# Patient Record
Sex: Female | Born: 1980
Health system: Southern US, Community
[De-identification: ages and names within clinical notes are randomized; demographics above are authoritative.]

## PROBLEM LIST (undated history)

## (undated) DIAGNOSIS — Z9289 Personal history of other medical treatment: Secondary | ICD-10-CM

## (undated) DIAGNOSIS — T7840XA Allergy, unspecified, initial encounter: Secondary | ICD-10-CM

## (undated) DIAGNOSIS — G43909 Migraine, unspecified, not intractable, without status migrainosus: Secondary | ICD-10-CM

## (undated) DIAGNOSIS — B019 Varicella without complication: Secondary | ICD-10-CM

## (undated) HISTORY — PX: WISDOM TOOTH EXTRACTION: SHX21

## (undated) HISTORY — DX: Personal history of other medical treatment: Z92.89

## (undated) HISTORY — DX: Allergy, unspecified, initial encounter: T78.40XA

## (undated) HISTORY — DX: Varicella without complication: B01.9

## (undated) HISTORY — PX: COLONOSCOPY: SHX174

## (undated) HISTORY — DX: Migraine, unspecified, not intractable, without status migrainosus: G43.909

---

## 2002-04-08 HISTORY — PX: LEFT OOPHORECTOMY: SHX1961

## 2013-08-18 ENCOUNTER — Ambulatory Visit: Payer: Self-pay | Admitting: Family Medicine

## 2015-11-23 DIAGNOSIS — R030 Elevated blood-pressure reading, without diagnosis of hypertension: Secondary | ICD-10-CM | POA: Insufficient documentation

## 2019-02-22 ENCOUNTER — Encounter: Payer: Self-pay | Admitting: Family Medicine

## 2019-02-22 ENCOUNTER — Ambulatory Visit (INDEPENDENT_AMBULATORY_CARE_PROVIDER_SITE_OTHER): Payer: No Typology Code available for payment source | Admitting: Family Medicine

## 2019-02-22 ENCOUNTER — Other Ambulatory Visit: Payer: Self-pay

## 2019-02-22 VITALS — BP 138/88 | HR 85 | Temp 98.1°F | Resp 16 | Ht 63.5 in | Wt 130.4 lb

## 2019-02-22 DIAGNOSIS — R030 Elevated blood-pressure reading, without diagnosis of hypertension: Secondary | ICD-10-CM

## 2019-02-22 DIAGNOSIS — Z13220 Encounter for screening for lipoid disorders: Secondary | ICD-10-CM

## 2019-02-22 DIAGNOSIS — Z0000 Encounter for general adult medical examination without abnormal findings: Secondary | ICD-10-CM | POA: Insufficient documentation

## 2019-02-22 DIAGNOSIS — D509 Iron deficiency anemia, unspecified: Secondary | ICD-10-CM | POA: Insufficient documentation

## 2019-02-22 DIAGNOSIS — Z131 Encounter for screening for diabetes mellitus: Secondary | ICD-10-CM | POA: Diagnosis not present

## 2019-02-22 DIAGNOSIS — Z7689 Persons encountering health services in other specified circumstances: Secondary | ICD-10-CM | POA: Insufficient documentation

## 2019-02-22 DIAGNOSIS — Z975 Presence of (intrauterine) contraceptive device: Secondary | ICD-10-CM | POA: Insufficient documentation

## 2019-02-22 LAB — CBC
HCT: 42.7 % (ref 36.0–46.0)
Hemoglobin: 14.4 g/dL (ref 12.0–15.0)
MCHC: 33.8 g/dL (ref 30.0–36.0)
MCV: 88.9 fl (ref 78.0–100.0)
Platelets: 236 10*3/uL (ref 150.0–400.0)
RBC: 4.81 Mil/uL (ref 3.87–5.11)
RDW: 13.8 % (ref 11.5–15.5)
WBC: 3.3 10*3/uL — ABNORMAL LOW (ref 4.0–10.5)

## 2019-02-22 LAB — COMPREHENSIVE METABOLIC PANEL
ALT: 12 U/L (ref 0–35)
AST: 15 U/L (ref 0–37)
Albumin: 4.5 g/dL (ref 3.5–5.2)
Alkaline Phosphatase: 38 U/L — ABNORMAL LOW (ref 39–117)
BUN: 9 mg/dL (ref 6–23)
CO2: 25 mEq/L (ref 19–32)
Calcium: 9.2 mg/dL (ref 8.4–10.5)
Chloride: 105 mEq/L (ref 96–112)
Creatinine, Ser: 0.78 mg/dL (ref 0.40–1.20)
GFR: 81.9 mL/min (ref >60.00)
Glucose, Bld: 91 mg/dL (ref 70–99)
Potassium: 4.2 mEq/L (ref 3.5–5.1)
Sodium: 138 mEq/L (ref 135–145)
Total Bilirubin: 0.7 mg/dL (ref 0.2–1.2)
Total Protein: 6.9 g/dL (ref 6.0–8.3)

## 2019-02-22 LAB — HEMOGLOBIN A1C: Hgb A1c MFr Bld: 5.3 % (ref 4.6–6.5)

## 2019-02-22 LAB — TSH: TSH: 2.12 u[IU]/mL (ref 0.35–4.50)

## 2019-02-22 LAB — LIPID PANEL
Cholesterol: 219 mg/dL — ABNORMAL HIGH (ref 0–200)
HDL: 83.8 mg/dL (ref >39.00)
LDL Cholesterol: 125 mg/dL — ABNORMAL HIGH (ref 0–99)
NonHDL: 134.89
Total CHOL/HDL Ratio: 3
Triglycerides: 51 mg/dL (ref 0.0–149.0)
VLDL: 10.2 mg/dL (ref 0.0–40.0)

## 2019-02-22 NOTE — Patient Instructions (Addendum)
  Health Maintenance, Female Adopting a healthy lifestyle and getting preventive care are important in promoting health and wellness. Ask your health care provider about:  The right schedule for you to have regular tests and exams.  Things you can do on your own to prevent diseases and keep yourself healthy. What should I know about diet, weight, and exercise? Eat a healthy diet   Eat a diet that includes plenty of vegetables, fruits, low-fat dairy products, and lean protein.  Do not eat a lot of foods that are high in solid fats, added sugars, or sodium. Maintain a healthy weight Body mass index (BMI) is used to identify weight problems. It estimates body fat based on height and weight. Your health care provider can help determine your BMI and help you achieve or maintain a healthy weight. Get regular exercise Get regular exercise. This is one of the most important things you can do for your health. Most adults should:  Exercise for at least 150 minutes each week. The exercise should increase your heart rate and make you sweat (moderate-intensity exercise).  Do strengthening exercises at least twice a week. This is in addition to the moderate-intensity exercise.  Spend less time sitting. Even light physical activity can be beneficial. Watch cholesterol and blood lipids Have your blood tested for lipids and cholesterol at 39 years of age, then have this test every 5 years. Have your cholesterol levels checked more often if:  Your lipid or cholesterol levels are high.  You are older than 40 years of age.  You are at high risk for heart disease. What should I know about cancer screening? Depending on your health history and family history, you may need to have cancer screening at various ages. This may include screening for:  Breast cancer.  Cervical cancer.  Colorectal cancer.  Skin cancer.  Lung cancer. What should I know about heart disease, diabetes, and high blood  pressure? Blood pressure and heart disease  High blood pressure causes heart disease and increases the risk of stroke. This is more likely to develop in people who have high blood pressure readings, are of African descent, or are overweight.  Have your blood pressure checked: ? Every 3-5 years if you are 18-39 years of age. ? Every year if you are 40 years old or older. Diabetes Have regular diabetes screenings. This checks your fasting blood sugar level. Have the screening done:  Once every three years after age 40 if you are at a normal weight and have a low risk for diabetes.  More often and at a younger age if you are overweight or have a high risk for diabetes. What should I know about preventing infection? Hepatitis B If you have a higher risk for hepatitis B, you should be screened for this virus. Talk with your health care provider to find out if you are at risk for hepatitis B infection. Hepatitis C Testing is recommended for:  Everyone born from 1945 through 1965.  Anyone with known risk factors for hepatitis C. Sexually transmitted infections (STIs)  Get screened for STIs, including gonorrhea and chlamydia, if: ? You are sexually active and are younger than 39 years of age. ? You are older than 39 years of age and your health care provider tells you that you are at risk for this type of infection. ? Your sexual activity has changed since you were last screened, and you are at increased risk for chlamydia or gonorrhea. Ask your health care   you are at risk.  Ask your health care provider about whether you are at high risk for HIV. Your health care provider may recommend a prescription medicine to help prevent HIV infection. If you choose to take medicine to prevent HIV, you should first get tested for HIV. You should then be tested every 3 months for as long as you are taking the medicine. Pregnancy  If you are about to stop having your period (premenopausal) and  you may become pregnant, seek counseling before you get pregnant.  Take 400 to 800 micrograms (mcg) of folic acid every day if you become pregnant.  Ask for birth control (contraception) if you want to prevent pregnancy. Osteoporosis and menopause Osteoporosis is a disease in which the bones lose minerals and strength with aging. This can result in bone fractures. If you are 65 years old or older, or if you are at risk for osteoporosis and fractures, ask your health care provider if you should:  Be screened for bone loss.  Take a calcium or vitamin D supplement to lower your risk of fractures.  Be given hormone replacement therapy (HRT) to treat symptoms of menopause. Follow these instructions at home: Lifestyle  Do not use any products that contain nicotine or tobacco, such as cigarettes, e-cigarettes, and chewing tobacco. If you need help quitting, ask your health care provider.  Do not use street drugs.  Do not share needles.  Ask your health care provider for help if you need support or information about quitting drugs. Alcohol use  Do not drink alcohol if: ? Your health care provider tells you not to drink. ? You are pregnant, may be pregnant, or are planning to become pregnant.  If you drink alcohol: ? Limit how much you use to 0-1 drink a day. ? Limit intake if you are breastfeeding.  Be aware of how much alcohol is in your drink. In the [place] one drink equals one 12 oz bottle of beer (355 mL), one 5 oz glass of wine (148 mL), or one 1 oz glass of hard liquor (44 mL). General instructions  Schedule regular health, dental, and eye exams.  Stay current with your vaccines.  Tell your health care provider if: ? You often feel depressed. ? You have ever been abused or do not feel safe at home. Summary  Adopting a healthy lifestyle and getting preventive care are important in promoting health and wellness.  Follow your health care provider's instructions about healthy  diet, exercising, and getting tested or screened for diseases.  Follow your health care provider's instructions on monitoring your cholesterol and blood pressure. This information is not intended to replace advice given to you by your health care provider. Make sure you discuss any questions you have with your health care provider. Document Released: 10/08/2010 Document Revised: 03/18/2018 Document Reviewed: 03/18/2018 Elsevier Patient Education  2020 Else[employer]  Please help us help you:  We are honored you have chosen [facility] for your Primary Care home. Below you will find basic instructions that you may need to access in the future. Please help us help you by reading the instructions, which cover many of the frequent questions we experience.   Prescription refills and request:  -In order to allow more efficient response time, please call your pharmacy for all refills. They will forward the request electronically to us. This allows for the quickest possible response. Request left on a nurse line can take longer to refill, since these are checked   as time allows between office patients and other phone calls.  - refill request can take up to 3-5 working days to complete.  - If request is sent electronically and request is appropiate, it is usually completed in 1-2 business days.  - all patients will need to be seen routinely for all chronic medical conditions requiring prescription medications (see follow-up below). If you are overdue for follow up on your condition, you will be asked to make an appointment and we will call in enough medication to cover you until your appointment (up to 30 days).  - all controlled substances will require a face to face visit to request/refill.  - if you desire your prescriptions to go through a new pharmacy, and have an active script at original pharmacy, you will need to call your pharmacy and have scripts transferred to new pharmacy. This is completed  between the pharmacy locations and not by your provider.    Results: If any images or labs were ordered, it can take up to 1 week to get results depending on the test ordered and the lab/facility running and resulting the test. - Normal or stable results, which do not need further discussion, may be released to your mychart immediately with attached note to you. A call may not be generated for normal results. Please make certain to sign up for mychart. If you have questions on how to activate your mychart you can call the front office.  - If your results need further discussion, our office will attempt to contact you via phone, and if unable to reach you after 2 attempts, we will release your abnormal result to your mychart with instructions.  - All results will be automatically released in mychart after 1 week.  - Your provider will provide you with explanation and instruction on all relevant material in your results. Please keep in mind, results and labs may appear confusing or abnormal to the untrained eye, but it does not mean they are actually abnormal for you personally. If you have any questions about your results that are not covered, or you desire more detailed explanation than what was provided, you should make an appointment with your provider to do so.   Our office handles many outgoing and incoming calls daily. If we have not contacted you within 1 week about your results, please check your mychart to see if there is a message first and if not, then contact our office.  In helping with this matter, you help decrease call volume, and therefore allow us to be able to respond to patients needs more efficiently.   Acute office visits (sick visit):  An acute visit is intended for a new problem and are scheduled in shorter time slots to allow schedule openings for patients with new problems. This is the appropriate visit to discuss a new problem. Problems will not be addressed by phone call or  Echart message. Appointment is needed if requesting treatment. In order to provide you with excellent quality medical care with proper time for you to explain your problem, have an exam and receive treatment with instructions, these appointments should be limited to one new problem per visit. If you experience a new problem, in which you desire to be addressed, please make an acute office visit, we save openings on the schedule to accommodate you. Please do not save your new problem for any other type of visit, let us take care of it properly and quickly for you.   Follow   up visits:  Depending on your condition(s) your provider will need to see you routinely in order to provide you with quality care and prescribe medication(s). Most chronic conditions (Example: hypertension, Diabetes, depression/anxiety... etc), require visits a couple times a year. Your provider will instruct you on proper follow up for your personal medical conditions and history. Please make certain to make follow up appointments for your condition as instructed. Failing to do so could result in lapse in your medication treatment/refills. If you request a refill, and are overdue to be seen on a condition, we will always provide you with a 30 day script (once) to allow you time to schedule.    Medicare wellness (well visit): - we have a wonderful Nurse (Kim), that will meet with you and provide you will yearly medicare wellness visits. These visits should occur yearly (can not be scheduled less than 1 calendar year apart) and cover preventive health, immunizations, advance directives and screenings you are entitled to yearly through your medicare benefits. Do not miss out on your entitled benefits, this is when medicare will pay for these benefits to be ordered for you.  These are strongly encouraged by your provider and is the appropriate type of visit to make certain you are up to date with all preventive health benefits. If you have not  had your medicare wellness exam in the last 12 months, please make certain to schedule one by calling the office and schedule your medicare wellness with Kim as soon as possible.   Yearly physical (well visit):  - Adults are recommended to be seen yearly for physicals. Check with your insurance and date of your last physical, most insurances require one calendar year between physicals. Physicals include all preventive health topics, screenings, medical exam and labs that are appropriate for gender/age and history. You may have fasting labs needed at this visit. This is a well visit (not a sick visit), new problems should not be covered during this visit (see acute visit).  - Pediatric patients are seen more frequently when they are younger. Your provider will advise you on well child visit timing that is appropriate for your their age. - This is not a medicare wellness visit. Medicare wellness exams do not have an exam portion to the visit. Some medicare companies allow for a physical, some do not allow a yearly physical. If your medicare allows a yearly physical you can schedule the medicare wellness with our nurse Kim and have your physical with your provider after, on the same day. Please check with insurance for your full benefits.   Late Policy/No Shows:  - all new patients should arrive 15-30 minutes earlier than appointment to allow us time  to  obtain all personal demographics,  insurance information and for you to complete office paperwork. - All established patients should arrive 10-15 minutes earlier than appointment time to update all information and be checked in .  - In our best efforts to run on time, if you are late for your appointment you will be asked to either reschedule or if able, we will work you back into the schedule. There will be a wait time to work you back in the schedule,  depending on availability.  - If you are unable to make it to your appointment as scheduled, please call  24 hours ahead of time to allow us to fill the time slot with someone else who needs to be seen. If you do not cancel your appointment ahead of   time, you may be charged a no show fee.  

## 2019-02-22 NOTE — Progress Notes (Signed)
Patient ID: Dawn Warren, female  DOB: 1980-07-15, 38 y.o.   MRN: [id number] Patient Care Team    Relationship Specialty Notifications Start End  Ma Hillock, DO PCP - General Family Medicine  02/22/19   Ob/Gyn, [facility]    02/22/19    Comment: Dr Nori Riis     Chief Complaint  Patient presents with  . [place] requested. Dr Nori Riis pap smear scheduled for 02/24/2019.     Subjective:  Dawn Warren is a 38 y.o.  female present for new patient establishment/acute problem. All past medical history, surgical history, allergies, family history, immunizations, medications and social history were updated in the electronic medical record today. All recent labs, ED visits and hospitalizations within the last year were reviewed.  Health maintenance:  Colonoscopy:no fhx. Routine screen at 49 Mammogram/PAP: Has an appt with Dr. Nori Riis next week.Mirena  IUD in place (2017) Immunizations: tdap UTD 2017, influenza UTD 01/2019 Infectious disease screening: HIV completed w/ pregnancy.    Depression screen PHQ 2/9 02/22/2019  Decreased Interest 0  Down, Depressed, Hopeless 0  PHQ - 2 Score 0   No flowsheet data found.      No flowsheet data found. Immunization History  Administered Date(s) Administered  . Influenza, Quadrivalent, Recombinant, Inj, Pf 11/23/2015  . PPD Test 11/20/2016, 12/01/2017    No exam data present  Past Medical History:  Diagnosis Date  . Allergy   . Chicken pox   . History of blood transfusion    unknown cause of anemia.   . Migraines    No Known Allergies Past Surgical History:  Procedure Laterality Date  . CESAREAN SECTION     2000, 2003, 2008, 2010  . COLONOSCOPY     2004, 2006  . LEFT OOPHORECTOMY  2004  . WISDOM TOOTH EXTRACTION     Family History  Problem Relation Age of Onset  . Rheum arthritis Mother   . Premature birth Son    Social History   Social History Narrative   Marital  status/children/pets: Married, 4 children.    Education/employment: Master in Nursing. Works at [employer].    Safety:      -smoke alarm in the home:Yes     - wears seatbelt: Yes     - Feels safe in their relationships: Yes    Allergies as of 02/22/2019   No Known Allergies     Medication List       Accurate as of February 22, 2019 12:26 PM. If you have any questions, ask your nurse or doctor.        acetaminophen 325 MG tablet Commonly known as: TYLENOL Take 650 mg by mouth every 6 (six) hours as needed.   levonorgestrel 20 MCG/24HR IUD Commonly known as: MIRENA 1 each by Intrauterine route once.      All past medical history, surgical history, allergies, family history, immunizations andmedications were updated in the EMR today and reviewed under the history and medication portions of their EMR.    No results found for this or any previous visit (from the past 2160 hour(s)).  Patient was never admitted.   ROS: 14 pt review of systems performed and negative (unless mentioned in an HPI)  Objective: BP 138/88 (BP Location: Left Arm, Patient Position: Sitting, Cuff Size: Normal)   Pulse 85   Temp 98.1 F (36.7 C) (Temporal)   Resp 16   Ht 5' 3.5" (1.613 m)  Wt 130 lb 6 oz (59.1 kg)   SpO2 98%   BMI 22.73 kg/m  Gen: Afebrile. No acute distress. Nontoxic in appearance, well-developed, well-nourished,  Very pleasant AAF. HENT: AT. [place]. Bilateral TM visualized and normal in appearance, normal external auditory canal. MMM, no oral lesions, adequate dentition. Bilateral nares within normal limits. Throat without erythema, ulcerations or exudates. no Cough on exam, no hoarseness on exam. Eyes:Pupils Equal Round Reactive to light, Extraocular movements intact,  Conjunctiva without redness, discharge or icterus. Neck/lymp/endocrine: Supple,no lymphadenopathy, no thyromegaly CV: RRR no murmur, no edema, +2/4 P posterior tibialis pulses.  Chest: CTAB, no wheeze, rhonchi or  crackles. normal Respiratory effort. good Air movement. Abd: Soft. flat. NTND. BS present. no Masses palpated. No hepatosplenomegaly. No rebound tenderness or guarding. Skin: no rashes, purpura or petechiae. Warm and well-perfused. Skin intact. Neuro/Msk:  Normal gait. PERLA. EOMi. Alert. Oriented x3.  Cranial nerves II through XII intact. Muscle strength 5/5 upper/lower extremity. DTRs equal bilaterally. Psych: Normal affect, dress and demeanor. Normal speech. Normal thought content and judgment.   Assessment/plan: Rajanee Schuelke is a 38 y.o. female present for est care Iron deficiency anemia, unspecified iron deficiency anemia type - pt reports h/o bld transfusion 2012 for anemia- unknown cause. 2017 IDA that responded to iron supplement.  - unknown testing surrounding anemia.  - CBC Lipid screening - Lipid panel Diabetes mellitus screening - Hemoglobin A1c Elevated BP without diagnosis of hypertension - borderline today and h/o elevation in the past. Pt reports prior it had been related to stress and she again is under some stress with interviews, school and her children's home school.  - Comp Met (CMET) - TSH Encounter for preventive health examination Patient was encouraged to exercise greater than 150 minutes a week. Patient was encouraged to choose a diet filled with fresh fruits and vegetables, and lean meats. AVS provided to patient today for education/recommendation on gender specific health and safety maintenance. Colonoscopy:no fhx. Routine screen at 49 Mammogram/PAP: Dawn Warren appt with Dr. Nori Riis next week.  Immunizations: tdap UTD 2017, influenza UTD 01/2019 Infectious disease screening: HIV completed w/ pregnancy.    Return in about 1 year (around 02/22/2020), or if symptoms worsen or fail to improve, for CPE (30 min).  Greater than 30 minutes spent with patient, >50% of time spent face to face   Note is dictated utilizing voice recognition software. Although note has  been proof read prior to signing, occasional typographical errors still can be missed. If any questions arise, please do not hesitate to call for verification.  Electronically signed by: Howard Pouch, DO [place]

## 2019-02-23 ENCOUNTER — Telehealth: Payer: Self-pay | Admitting: Family Medicine

## 2019-02-23 NOTE — Telephone Encounter (Signed)
Pt was called and given lab results, she verbalized understanding.  

## 2019-02-23 NOTE — Telephone Encounter (Signed)
Please inform patient the following information: Her liver/kidney/thyroid are functioning normal.  Her diabetes screening/A1c is in normal range at 5.3. Her cholesterol looks great with a total cholesterol of 219, good cholesterol/HDL of 83, LDL/bad cholesterol 125 and triglycerides 51.  Her blood counts were normal, with the exception of mildly lower than normal level of WBC at 3.3.  I was able to review 1 additional lab collection in the past through the care everywhere tab in the EMR,  and this was pretty consistent what the level at that time of 3.7.  This is likely her normal, but we will continue to monitor with her yearly physicals.

## 2019-03-02 ENCOUNTER — Other Ambulatory Visit: Payer: Self-pay | Admitting: Obstetrics & Gynecology

## 2019-03-02 DIAGNOSIS — R928 Other abnormal and inconclusive findings on diagnostic imaging of breast: Secondary | ICD-10-CM

## 2019-03-09 ENCOUNTER — Other Ambulatory Visit: Payer: Self-pay

## 2019-03-09 ENCOUNTER — Telehealth: Payer: Self-pay | Admitting: Family Medicine

## 2019-03-09 ENCOUNTER — Ambulatory Visit
Admission: RE | Admit: 2019-03-09 | Discharge: 2019-03-09 | Disposition: A | Discharge status: Home / Self Care | Payer: No Typology Code available for payment source | Source: Ambulatory Visit | Attending: Obstetrics & Gynecology | Admitting: Obstetrics & Gynecology

## 2019-03-09 DIAGNOSIS — R928 Other abnormal and inconclusive findings on diagnostic imaging of breast: Secondary | ICD-10-CM

## 2019-03-09 NOTE — Telephone Encounter (Signed)
Pt was called and given labs results, pt verbalizes understanding.   Pt was sent Code for My chart. She would like results sent to her once she has gotten on my chart. Pt was asked to send message when she does get it and I can send her Dr Dierdre Highman recommendations, she verbalized understanding. Pt did not want to schedule F/U appt at this time

## 2019-03-09 NOTE — Telephone Encounter (Signed)
Patient had mammogram that showed calcium deposits in her breast. The technician advised the patient it was a little unusual for her to have them since she is not a diabetic and to contact her primary care provider. Patient would like to know if she should have any other imaging done.

## 2019-03-09 NOTE — Telephone Encounter (Signed)
Patient's mammogram did not show evidence of any malignancy or cancers.  There were benign left breast arterial calcifications.  Calcification in any arteries is a sign of arteriolosclerosis.  If calcifications are appreciated in the breast arteries, they are likely also in other arteries of her body.  This is typically caused by diabetes, hypercholesterol or hypertension.   - She has been seen once, she established in November with a CPE.  During her CPE labs>>> she does not have diabetes.   Her cholesterol is well controlled.  She also did not report any family history of cardiac disease.  - She did have borderline elevated blood pressures.  Would advise her to follow-up in 3 months for recheck with provider and if blood pressures are still borderline or elevated, would encourage to start medication at that time.  In the meantime maintaining routine exercise and a heart healthy diet, such as a Mediterranean diet would be helpful.

## 2019-03-09 NOTE — Telephone Encounter (Signed)
Pt was called told that we do not have a report that has been read by radiologist. She just had mammogram done 1 hour ago. Pt was assured she would be called as soon as these were read and sent to Dr Raoul Pitch

## 2019-03-15 ENCOUNTER — Other Ambulatory Visit: Payer: Self-pay

## 2019-03-15 ENCOUNTER — Ambulatory Visit (INDEPENDENT_AMBULATORY_CARE_PROVIDER_SITE_OTHER): Payer: No Typology Code available for payment source | Admitting: Family Medicine

## 2019-03-15 ENCOUNTER — Encounter: Payer: Self-pay | Admitting: Family Medicine

## 2019-03-15 VITALS — BP 122/84 | HR 103 | Temp 97.6°F | Resp 16 | Ht 64.0 in | Wt 130.2 lb

## 2019-03-15 DIAGNOSIS — R0789 Other chest pain: Secondary | ICD-10-CM | POA: Diagnosis not present

## 2019-03-15 NOTE — Progress Notes (Signed)
This visit occurred during the SARS-CoV-2 public health emergency.  Safety protocols were in place, including screening questions prior to the visit, additional usage of staff PPE, and extensive cleaning of exam room while observing appropriate contact time as indicated for disinfecting solutions.    Dawn Warren , 1980-05-06, 38 y.o., female MRN: [id number] Patient Care Team    Relationship Specialty Notifications Start End  Ma Hillock, DO PCP - General Family Medicine  02/22/19   Ob/Gyn, [facility]    02/22/19    Comment: Dr Nori Riis     Chief Complaint  Patient presents with  . Hypertension    Pt has been concerned over BP readings at work and is having off and on chest discomfort, this last will only last a minute or two and goes away. Denies SOB, lightheadness, arm/jaw pain.   . Discuss mammogram results     Subjective: Pt presents for an OV with concerns over elevated blood pressure readings.  She was seen here approximately 3 weeks ago with borderline blood pressures at that time.  She reports she has been checking her blood pressures since that time and some have been elevated, but they seem to have leveled out.  She endorses some chest discomfort caused her to have some extra concern since she had borderline blood pressures.  She states the chest discomfort started shortly after her last visit 3 weeks ago.  The pain can be rather random in nature and quick and fleeting on discomfort.  It is located in her central chest and does not radiate.  It is not worse with activity.  She has not tried anything for it.  She states sometimes her heart rate will get increased but that only occurs when she is becoming anxious.  She does work at the hospital and moves patients frequently.  She endorses her chest is uncomfortable if she pushes over the area of her concern.  She denies any dizziness, shortness of breath or lower extremity edema  Depression screen PHQ 2/9 02/22/2019  Decreased  Interest 0  Down, Depressed, Hopeless 0  PHQ - 2 Score 0    No Known Allergies Social History   Social History Narrative   Marital status/children/pets: Married, 4 children.    Education/employment: Master in Nursing. Works at [employer].    Safety:      -smoke alarm in the home:Yes     - wears seatbelt: Yes     - Feels safe in their relationships: Yes   Past Medical History:  Diagnosis Date  . Allergy   . Chicken pox   . History of blood transfusion    unknown cause of anemia.   Marland Kitchen Migraines    Past Surgical History:  Procedure Laterality Date  . CESAREAN SECTION     2000, 2003, 2008, 2010  . COLONOSCOPY     2004, 2006  . LEFT OOPHORECTOMY  2004  . WISDOM TOOTH EXTRACTION     Family History  Problem Relation Age of Onset  . Rheum arthritis Mother   . Premature birth Son    Allergies as of 03/15/2019   No Known Allergies     Medication List       Accurate as of March 15, 2019  2:45 PM. If you have any questions, ask your nurse or doctor.        STOP taking these medications   acetaminophen 325 MG tablet Commonly known as: TYLENOL Stopped by: Howard Pouch, DO  TAKE these medications   co-enzyme Q-10 30 MG capsule Take 30 mg by mouth daily.   levonorgestrel 20 MCG/24HR IUD Commonly known as: MIRENA 1 each by Intrauterine route once.   Omega 3 1000 MG Caps Take 1 capsule by mouth daily.   Red Yeast Rice 600 MG Caps Take 1 capsule by mouth daily.       All past medical history, surgical history, allergies, family history, immunizations andmedications were updated in the EMR today and reviewed under the history and medication portions of their EMR.     ROS: Negative, with the exception of above mentioned in HPI   Objective:  BP 122/84 (BP Location: Left Arm, Patient Position: Sitting, Cuff Size: Normal)   Pulse (!) 103   Temp 97.6 F (36.4 C) (Temporal)   Resp 16   Ht 5\' 4"  (1.626 m)   Wt 130 lb 4 oz (59.1 kg)   LMP 03/01/2019  (Exact Date)   SpO2 99%   BMI 22.36 kg/m  Body mass index is 22.36 kg/m. Gen: Afebrile. No acute distress. Nontoxic in appearance, well developed, well nourished.  Very pleasant thin African-American female. HENT: AT. [place]. Eyes:Pupils Equal Round Reactive to light, Extraocular movements intact,  Conjunctiva without redness, discharge or icterus. CV: RRR (not tachycardic during exam) no murmur, no edema Chest: CTAB, no wheeze or crackles. Good air movement, normal resp effort.  TTP midline chest. Neuro:  Normal gait. PERLA. EOMi. Alert. Oriented x3  Psych: Normal affect, dress and demeanor. Normal speech. Normal thought content and judgment.  No exam data present No results found. No results found for this or any previous visit (from the past 24 hour(s)).  Assessment/Plan: Dawn Warren is a 38 y.o. female present for OV for  Costochondral chest pain His blood pressure is normal here today.  Her symptoms are more consistent with costochondritis.  She is tender to palpation over the area of her concern on exam today. Reviewed mammogram results with vessel calcifications.  This was explained to her in more detail today.  She reports understanding.  Her cholesterol panel has been good.  She is a non-smoker.  Her blood pressure is controlled.  And she is not a diabetic. AVS on costochondritis was supplied to her today.  Discussed NSAID therapy if symptoms become worse.   Reviewed expectations re: course of current medical issues.  Discussed self-management of symptoms.  Outlined signs and symptoms indicating need for more acute intervention.  Patient verbalized understanding and all questions were answered.  Patient received an After-Visit Summary.    No orders of the defined types were placed in this encounter.  > 15 minutes spent with patient, > 50% of that time face to face     Note is dictated utilizing voice recognition software. Although note has been proof read prior to  signing, occasional typographical errors still can be missed. If any questions arise, please do not hesitate to call for verification.   electronically signed by:  24, DO  [facility] [place]

## 2019-03-15 NOTE — Patient Instructions (Signed)
Mediterranean Diet A Mediterranean diet refers to food and lifestyle choices that are based on the traditions of countries located on the [place]. This way of eating has been shown to help prevent certain conditions and improve outcomes for people who have chronic diseases, like kidney disease and heart disease. What are tips for following this plan? Lifestyle  Cook and eat meals together with your family, when possible.  Drink enough fluid to keep your urine clear or pale yellow.  Be physically active every day. This includes: ? Aerobic exercise like running or swimming. ? Leisure activities like gardening, walking, or housework.  Get 7-8 hours of sleep each night.  If recommended by your health care provider, drink red wine in moderation. This means 1 glass a day for nonpregnant women and 2 glasses a day for men. A glass of wine equals 5 oz (150 mL). Reading food labels   Check the serving size of packaged foods. For foods such as rice and pasta, the serving size refers to the amount of cooked product, not dry.  Check the total fat in packaged foods. Avoid foods that have saturated fat or trans fats.  Check the ingredients list for added sugars, such as corn syrup. Shopping  At the grocery store, buy most of your food from the areas near the walls of the store. This includes: ? Fresh fruits and vegetables (produce). ? Grains, beans, nuts, and seeds. Some of these may be available in unpackaged forms or large amounts (in bulk). ? Fresh seafood. ? Poultry and eggs. ? Low-fat dairy products.  Buy whole ingredients instead of prepackaged foods.  Buy fresh fruits and vegetables in-season from local farmers markets.  Buy frozen fruits and vegetables in resealable bags.  If you do not have access to quality fresh seafood, buy precooked frozen shrimp or canned fish, such as tuna, salmon, or sardines.  Buy small amounts of raw or cooked vegetables, salads, or olives  from the deli or salad bar at your store.  Stock your pantry so you always have certain foods on hand, such as olive oil, canned tuna, canned tomatoes, rice, pasta, and beans. Cooking  Cook foods with extra-virgin olive oil instead of using butter or other vegetable oils.  Have meat as a side dish, and have vegetables or grains as your main dish. This means having meat in small portions or adding small amounts of meat to foods like pasta or stew.  Use beans or vegetables instead of meat in common dishes like chili or lasagna.  Experiment with different cooking methods. Try roasting or broiling vegetables instead of steaming or sauteing them.  Add frozen vegetables to soups, stews, pasta, or rice.  Add nuts or seeds for added healthy fat at each meal. You can add these to yogurt, salads, or vegetable dishes.  Marinate fish or vegetables using olive oil, lemon juice, garlic, and fresh herbs. Meal planning   Plan to eat 1 vegetarian meal one day each week. Try to work up to 2 vegetarian meals, if possible.  Eat seafood 2 or more times a week.  Have healthy snacks readily available, such as: ? Vegetable sticks with hummus. ? [place] yogurt. ? Fruit and nut trail mix.  Eat balanced meals throughout the week. This includes: ? Fruit: 2-3 servings a day ? Vegetables: 4-5 servings a day ? Low-fat dairy: 2 servings a day ? Fish, poultry, or lean meat: 1 serving a day ? Beans and legumes: 2 or more servings  a week ? Nuts and seeds: 1-2 servings a day ? Whole grains: 6-8 servings a day ? Extra-virgin olive oil: 3-4 servings a day  Limit red meat and sweets to only a few servings a month What are my food choices?  Mediterranean diet ? Recommended  Grains: Whole-grain pasta. Brown rice. Bulgar wheat. Polenta. Couscous. Whole-wheat bread. [place].  Vegetables: Artichokes. Beets. Broccoli. Cabbage. Carrots. Eggplant. Green beans. Chard. Kale. Spinach. Onions. Leeks. Peas.  Squash. Tomatoes. Peppers. Radishes.  Fruits: Apples. Apricots. Avocado. Berries. Bananas. Cherries. Dates. Figs. Grapes. Lemons. Melon. Oranges. Peaches. Plums. Pomegranate.  Meats and other protein foods: Beans. Almonds. Sunflower seeds. Pine nuts. Peanuts. [place]. Salmon. Scallops. Shrimp. [place]. Tilapia. Clams. Oysters. Eggs.  Dairy: Low-fat milk. Cheese. Greek yogurt.  Beverages: Water. Red wine. Herbal tea.  Fats and oils: Extra virgin olive oil. Avocado oil. Grape seed oil.  Sweets and desserts: [place] yogurt with honey. Baked apples. Poached pears. Trail mix.  Seasoning and other foods: Basil. Cilantro. Coriander. Cumin. Mint. Parsley. Sage. Rosemary. Tarragon. Garlic. Oregano. Thyme. Pepper. Balsalmic vinegar. Tahini. Hummus. Tomato sauce. Olives. Mushrooms. ? Limit these  Grains: Prepackaged pasta or rice dishes. Prepackaged cereal with added sugar.  Vegetables: Deep fried potatoes (french fries).  Fruits: Fruit canned in syrup.  Meats and other protein foods: Beef. Pork. Lamb. Poultry with skin. Hot dogs. Berniece Salines.  Dairy: Ice cream. Sour cream. Whole milk.  Beverages: Juice. Sugar-sweetened soft drinks. Beer. Liquor and spirits.  Fats and oils: Butter. Canola oil. Vegetable oil. Beef fat (tallow). Lard.  Sweets and desserts: Cookies. Cakes. Pies. Candy.  Seasoning and other foods: Mayonnaise. Premade sauces and marinades. The items listed may not be a complete list. Talk with your dietitian about what dietary choices are right for you. Summary  The Mediterranean diet includes both food and lifestyle choices.  Eat a variety of fresh fruits and vegetables, beans, nuts, seeds, and whole grains.  Limit the amount of red meat and sweets that you eat.  Talk with your health care provider about whether it is safe for you to drink red wine in moderation. This means 1 glass a day for nonpregnant women and 2 glasses a day for men. A glass of wine equals 5 oz (150 mL). This  information is not intended to replace advice given to you by your health care provider. Make sure you discuss any questions you have with your health care provider. Document Released: 11/16/2015 Document Revised: 11/23/2015 Document Reviewed: 11/16/2015 Elsevier Patient Education  [place].   Costochondritis Costochondritis is swelling and irritation (inflammation) of the tissue (cartilage) that connects your ribs to your breastbone (sternum). This causes pain in the front of your chest. Usually, the pain:  Starts gradually.  Is in more than one rib. This condition usually goes away on its own over time. Follow these instructions at home:  Do not do anything that makes your pain worse.  If directed, put ice on the painful area: ? Put ice in a plastic bag. ? Place a towel between your skin and the bag. ? Leave the ice on for 20 minutes, 2-3 times a day.  If directed, put heat on the affected area as often as told by your doctor. Use the heat source that your doctor tells you to use, such as a moist heat pack or a heating pad. ? Place a towel between your skin and the heat source. ? Leave the heat on for 20-30 minutes. ? Take off the heat if your  skin turns bright red. This is very important if you cannot feel pain, heat, or cold. You may have a greater risk of getting burned.  Take over-the-counter and prescription medicines only as told by your doctor.  Return to your normal activities as told by your doctor. Ask your doctor what activities are safe for you.  Keep all follow-up visits as told by your doctor. This is important. Contact a doctor if:  You have chills or a fever.  Your pain does not go away or it gets worse.  You have a cough that does not go away. Get help right away if:  You are short of breath. This information is not intended to replace advice given to you by your health care provider. Make sure you discuss any questions you have with your health  care provider. Document Released: 09/11/2007 Document Revised: 04/09/2017 Document Reviewed: 07/19/2015 Elsevier Patient Education  2020 [employer].

## 2019-11-02 ENCOUNTER — Telehealth: Payer: Self-pay

## 2019-11-02 NOTE — Telephone Encounter (Signed)
Pt was called and scheduled for a nurse visit to do vision and hearing.

## 2019-11-02 NOTE — Telephone Encounter (Signed)
Patient called about filling out physical form. She is enrolling in [facility] to their Nurse Practitioner program and needs this completed and signed. She does not have insurance at this time. She also wants to know the cost of the visit.  I told her I could not give give a qoute but only give her a ball park price,  Please review form patient sent me.   She stated Dr. Claiborne Billings did a physical on her when she was here in November 2020.  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  Please call patient at [phone].  I have emailed forms to Lurena Joiner  Thank you!

## 2019-11-02 NOTE — Telephone Encounter (Signed)
Patient is sending updated immunization records to [facility].  Patient would like to schedule the hearing & vision portion of the paperwork to be completed. How should she be scheduled?

## 2019-11-02 NOTE — Telephone Encounter (Signed)
I printed the paperwork needing to be filled out. We do not have any immunization records for her. She said her prior PCP was [facility] but the only thing we received from them is the two TB tests she received in 2018-2019. We are unable to fill any of that out unless we get the immunizations records. We also did not do  A hearing or vision test and they are asking for TB test or the Lab drawn for TB.   Dawn Warren she will have to provide [place] or we would have to get immunization records. I tried looking her up in the [facility] database and she is not in there.

## 2019-11-02 NOTE — Telephone Encounter (Signed)
If she is just needing the hearing, vision and PPD test-then she can be scheduled as a nurse visit to have those completed.  She is needing the blood test, she will need a provider appointment so that I can document reasoning for lab draw.

## 2019-11-02 NOTE — Telephone Encounter (Signed)
Placed paperwork on Dr Blenda Bridegroom desk to review and give guidance. Pt will need vision, hearing, and TB test.

## 2019-11-04 ENCOUNTER — Ambulatory Visit (INDEPENDENT_AMBULATORY_CARE_PROVIDER_SITE_OTHER): Payer: Self-pay

## 2019-11-04 ENCOUNTER — Other Ambulatory Visit: Payer: Self-pay

## 2019-11-04 DIAGNOSIS — Z0000 Encounter for general adult medical examination without abnormal findings: Secondary | ICD-10-CM

## 2019-11-04 DIAGNOSIS — Z0110 Encounter for examination of ears and hearing without abnormal findings: Secondary | ICD-10-CM

## 2019-11-04 DIAGNOSIS — Z0100 Encounter for examination of eyes and vision without abnormal findings: Secondary | ICD-10-CM

## 2019-11-04 NOTE — Progress Notes (Signed)
Pt came in for hearing/vision screening. Readings were documented in chart.

## 2019-11-04 NOTE — Telephone Encounter (Signed)
Vision and hearing test completed. Placed on Dr Blenda Bridegroom desk to review.

## 2019-11-05 NOTE — Telephone Encounter (Signed)
Pt was called and told she could come and pick up completed paperwork. Copy made and sent to scan.

## 2019-11-05 NOTE — Telephone Encounter (Signed)
Forms completed and returned to RB work station

## 2021-01-12 ENCOUNTER — Other Ambulatory Visit (HOSPITAL_BASED_OUTPATIENT_CLINIC_OR_DEPARTMENT_OTHER): Payer: Self-pay

## 2021-01-12 MED ORDER — FLUARIX QUADRIVALENT 0.5 ML IM SUSY
PREFILLED_SYRINGE | INTRAMUSCULAR | 0 refills | Status: AC
  Filled 2021-01-12: qty 0.5, 1d supply, fill #0

## 2021-11-23 IMAGING — MG DIGITAL DIAGNOSTIC UNILAT LEFT W/ CAD
3 series · 3 of 3 positions shown · non-contrast
Comparison: Previous exam(s).

CLINICAL DATA: Left breast calcifications on a recent baseline
screening mammogram. Intermittent hypertension.

EXAM:
DIGITAL DIAGNOSTIC LEFT MAMMOGRAM

[L CC (1 of 2)]
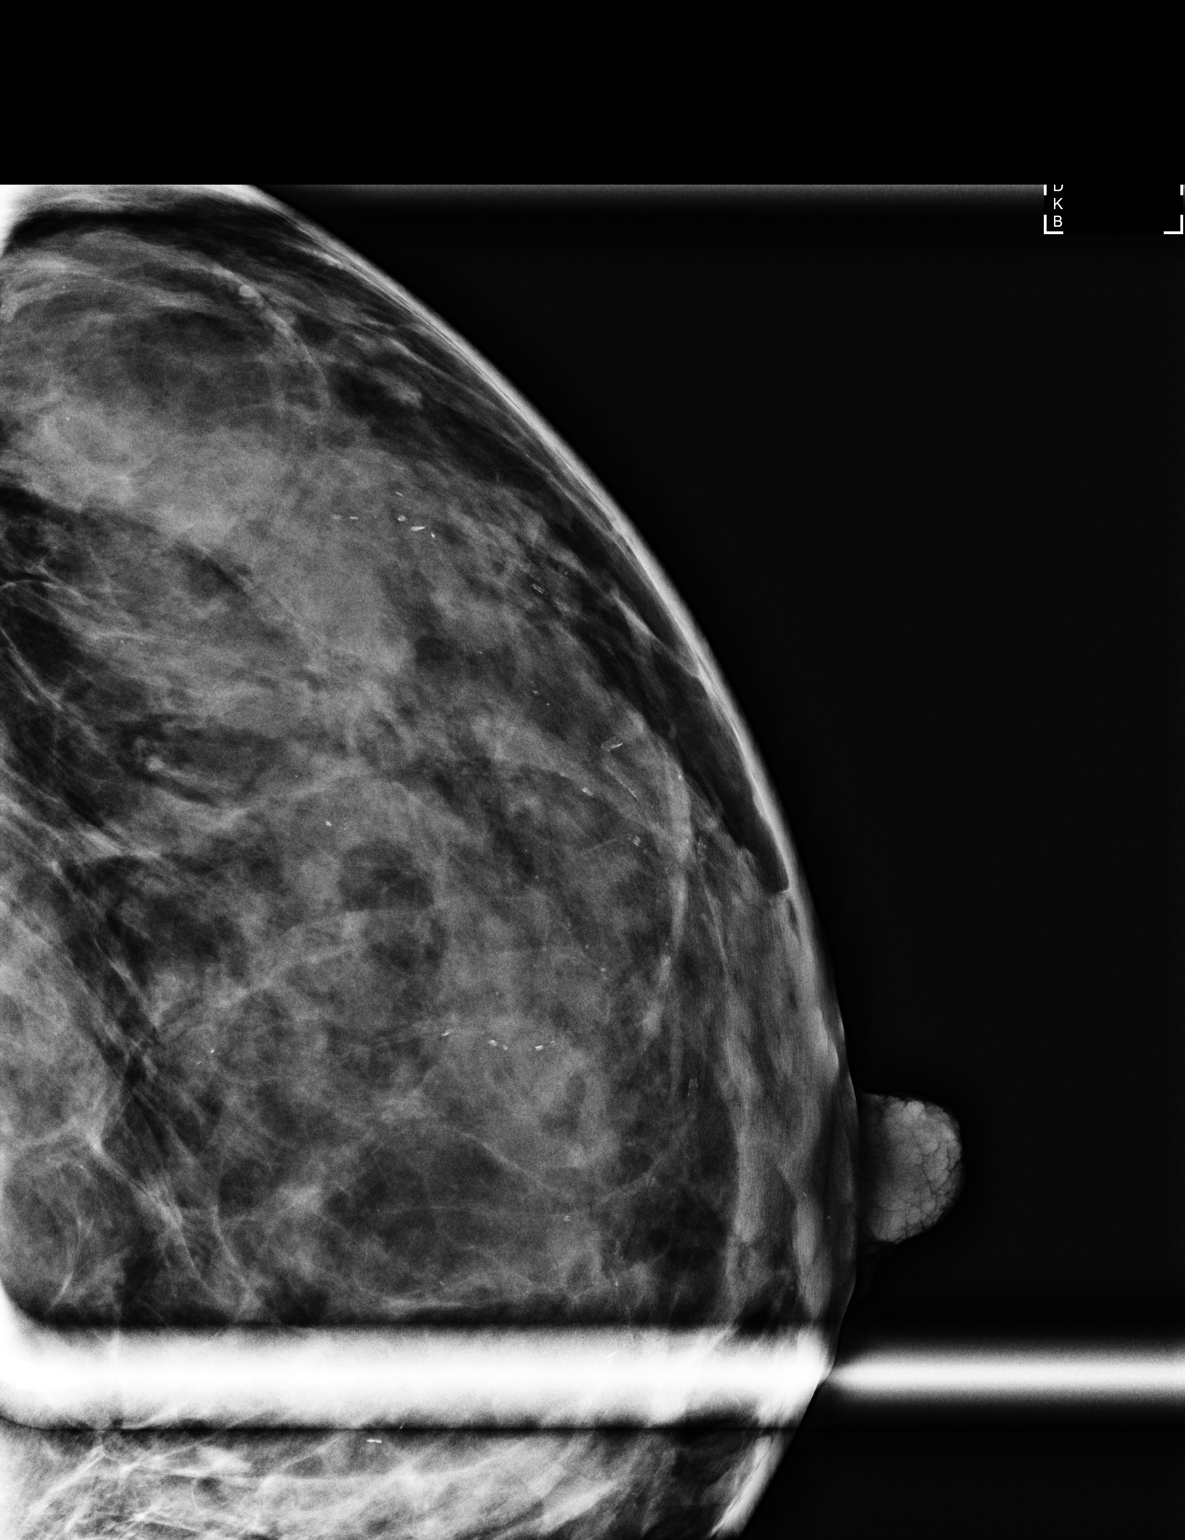

[L CC (2 of 2)]
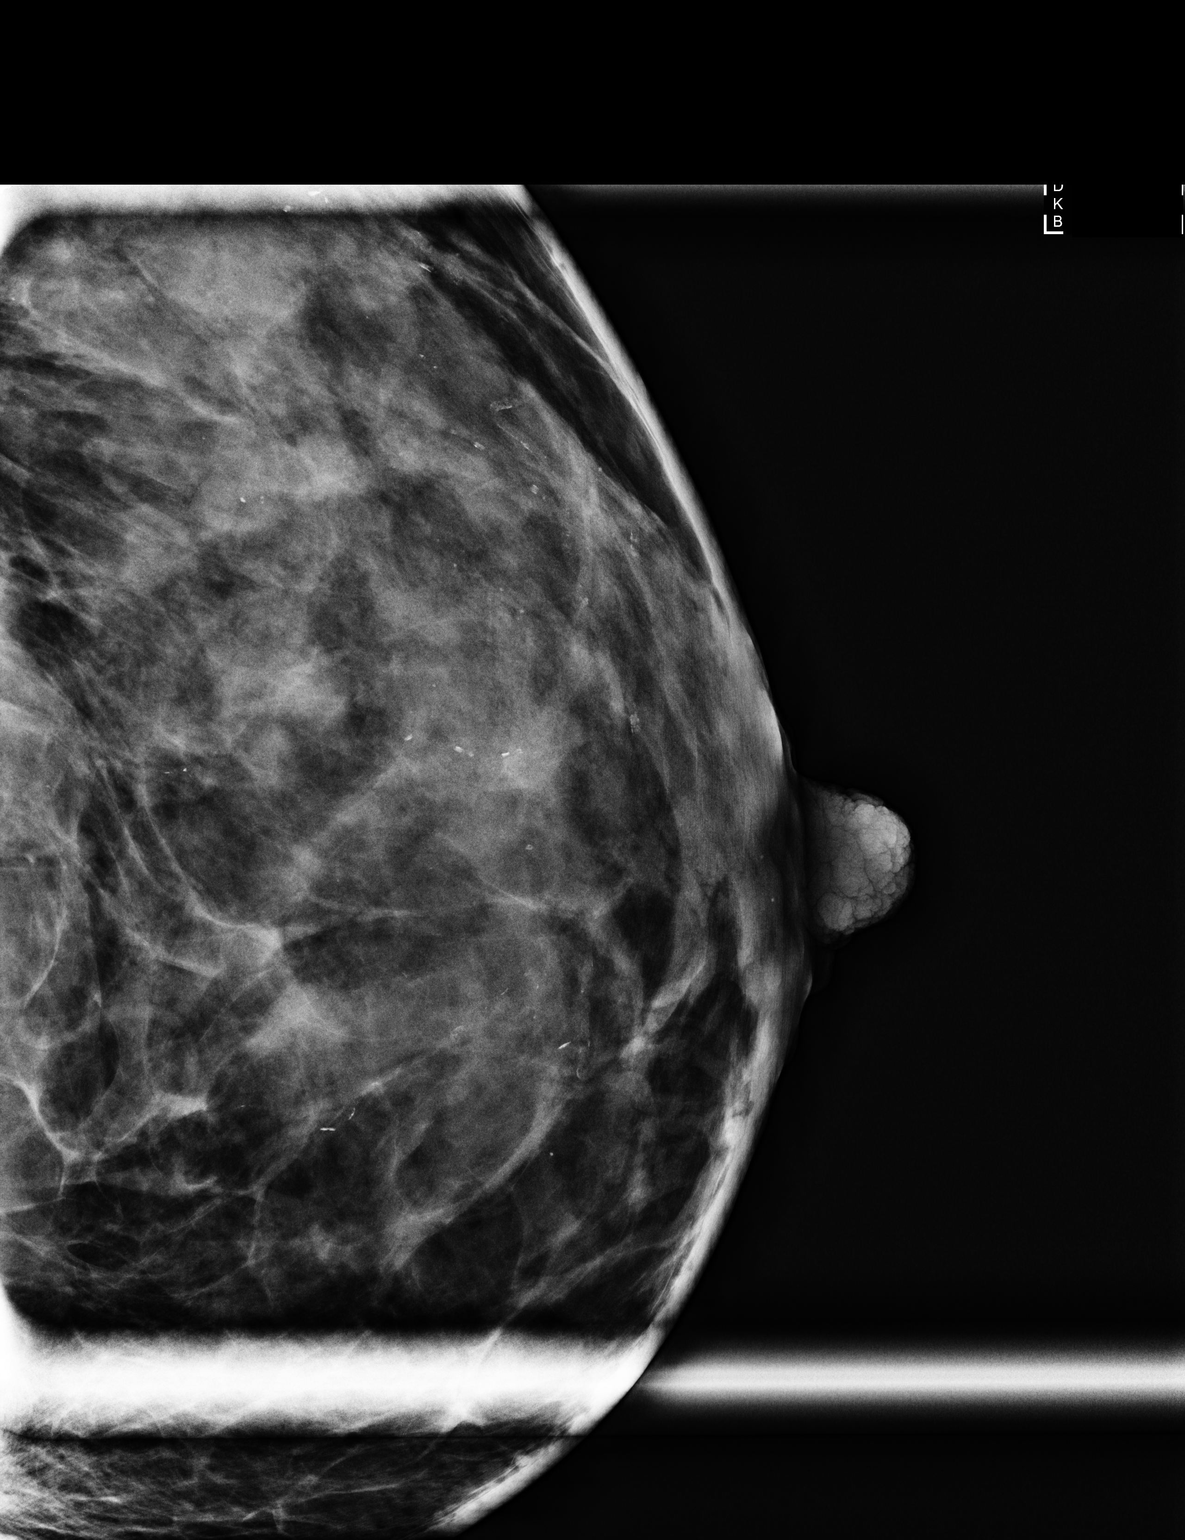

[L ML]
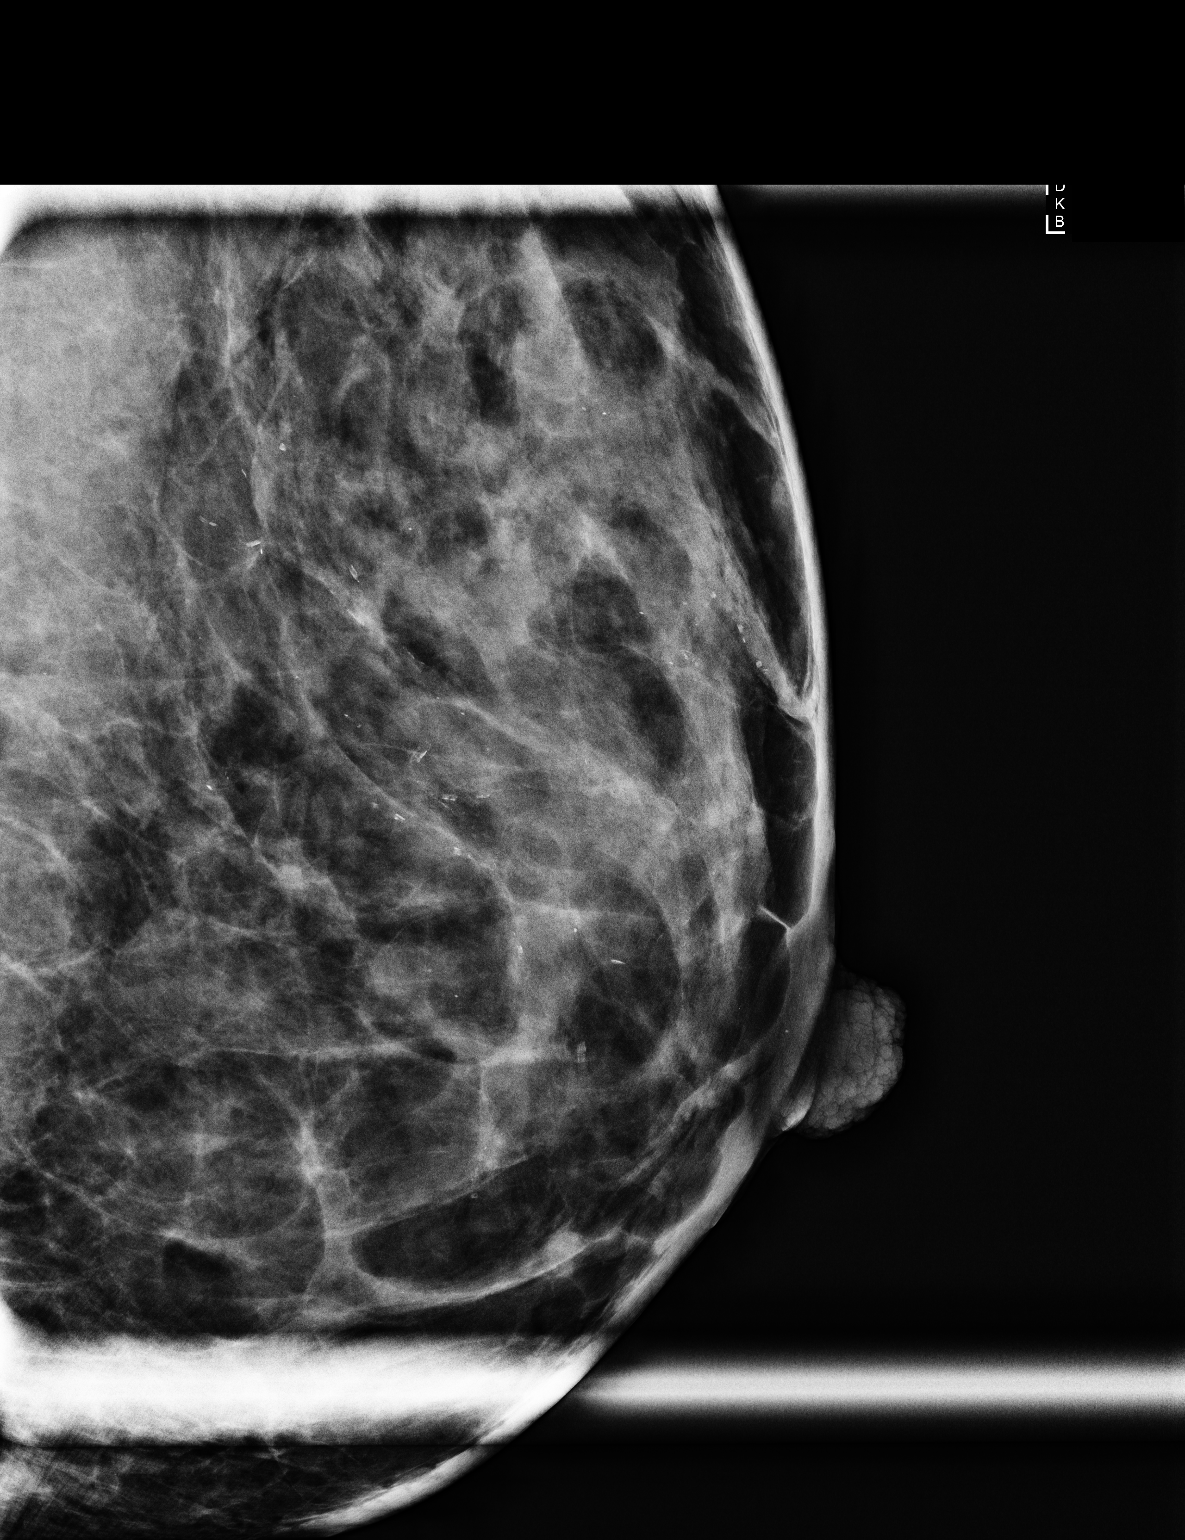

[3 of 3 positions shown; findings below may reference images not displayed]

ACR Breast Density Category d: The breast tissue is extremely dense,
which lowers the sensitivity of mammography.
FINDINGS: 2D spot magnification views of the left breast demonstrate arterial
calcifications involving multiple arteries in the breast,
corresponding to the calcifications seen on the recent screening
mammogram. On the screening mammogram, there are similar
calcifications in the right breast, to a somewhat lesser degree.
IMPRESSION: Benign left breast arterial calcifications. No evidence of
malignancy.

RECOMMENDATION:
Bilateral screening mammogram in 1 year.

I have discussed the findings and recommendations with the patient.
If applicable, a reminder letter will be sent to the patient
regarding the next appointment.

BI-RADS CATEGORY  2: Benign.

## 2022-01-02 ENCOUNTER — Other Ambulatory Visit: Payer: Self-pay | Admitting: Obstetrics and Gynecology

## 2022-01-02 DIAGNOSIS — R928 Other abnormal and inconclusive findings on diagnostic imaging of breast: Secondary | ICD-10-CM

## 2022-01-09 ENCOUNTER — Ambulatory Visit
Admission: RE | Admit: 2022-01-09 | Discharge: 2022-01-09 | Disposition: A | Discharge status: Home / Self Care | Payer: 59 | Source: Ambulatory Visit | Attending: Obstetrics and Gynecology | Admitting: Obstetrics and Gynecology

## 2022-01-09 DIAGNOSIS — R928 Other abnormal and inconclusive findings on diagnostic imaging of breast: Secondary | ICD-10-CM

## 2023-02-07 ENCOUNTER — Other Ambulatory Visit: Payer: Self-pay | Admitting: Obstetrics and Gynecology

## 2023-02-07 DIAGNOSIS — Z1231 Encounter for screening mammogram for malignant neoplasm of breast: Secondary | ICD-10-CM

## 2023-03-03 ENCOUNTER — Ambulatory Visit: Payer: 59

## 2023-03-04 ENCOUNTER — Ambulatory Visit
Admission: RE | Admit: 2023-03-04 | Discharge: 2023-03-04 | Disposition: A | Discharge status: Home / Self Care | Payer: 59 | Source: Ambulatory Visit | Attending: Obstetrics and Gynecology | Admitting: Obstetrics and Gynecology

## 2023-03-04 DIAGNOSIS — Z1231 Encounter for screening mammogram for malignant neoplasm of breast: Secondary | ICD-10-CM

## 2024-02-16 ENCOUNTER — Other Ambulatory Visit: Payer: Self-pay | Admitting: Obstetrics and Gynecology

## 2024-02-16 DIAGNOSIS — Z1231 Encounter for screening mammogram for malignant neoplasm of breast: Secondary | ICD-10-CM

## 2024-03-10 ENCOUNTER — Ambulatory Visit
Admission: RE | Admit: 2024-03-10 | Discharge: 2024-03-10 | Disposition: A | Source: Ambulatory Visit | Attending: Obstetrics and Gynecology

## 2024-03-10 DIAGNOSIS — Z1231 Encounter for screening mammogram for malignant neoplasm of breast: Secondary | ICD-10-CM
